# Patient Record
Sex: Female | Born: 1995 | Race: White | Hispanic: No | Marital: Single | Smoking: Never smoker
Health system: Southern US, Community
[De-identification: ages and names within clinical notes are randomized; demographics above are authoritative.]

---
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
## 2019-08-06 DIAGNOSIS — S02839A Fracture of medial orbital wall, unspecified side, initial encounter for closed fracture: Secondary | ICD-10-CM

## 2019-08-06 HISTORY — DX: Fracture of medial orbital wall, unspecified side, initial encounter for closed fracture: S02.839A

## 2020-06-13 ENCOUNTER — Other Ambulatory Visit: Payer: Self-pay

## 2020-06-13 ENCOUNTER — Emergency Department (HOSPITAL_COMMUNITY)
Admission: EM | Admit: 2020-06-13 | Discharge: 2020-06-13 | Disposition: A | Discharge status: Home / Self Care | Payer: Federal, State, Local not specified - PPO | Attending: Emergency Medicine | Admitting: Emergency Medicine

## 2020-06-13 ENCOUNTER — Encounter (HOSPITAL_COMMUNITY): Payer: Self-pay

## 2020-06-13 DIAGNOSIS — J029 Acute pharyngitis, unspecified: Secondary | ICD-10-CM | POA: Insufficient documentation

## 2020-06-13 DIAGNOSIS — R0981 Nasal congestion: Secondary | ICD-10-CM | POA: Diagnosis not present

## 2020-06-13 DIAGNOSIS — J3489 Other specified disorders of nose and nasal sinuses: Secondary | ICD-10-CM | POA: Insufficient documentation

## 2020-06-13 DIAGNOSIS — J069 Acute upper respiratory infection, unspecified: Secondary | ICD-10-CM

## 2020-06-13 NOTE — ED Provider Notes (Signed)
[facility] EMERGENCY DEPARTMENT Provider Note   CSN: [id number] Arrival date & time: 06/13/20  2019     History Chief Complaint  Patient presents with  . Nasal Congestion  . Sore Throat    Barbara Richard is a 24 y.o. female.  HPI She presents for evaluation of fever, rhinorrhea and sore throat, characterized by pain with swallowing.  Symptom onset 3 days ago.  No documented fever.  She has had 1 of 2 Covid vaccines.  No clear known sick exposures.  No nausea or vomiting.  There are no other known modifying factors.  She requests a work note to return tomorrow    History reviewed. No pertinent past medical history.  Patient Active Problem List   Diagnosis Date Noted  . Encounter for Nexplanon removal 02/26/2013  . Child in foster care 11/22/2010  . ADHD 05/04/2008    History reviewed. No pertinent surgical history.   OB History   No obstetric history on file.     History reviewed. No pertinent family history.  Social History   Tobacco Use  . Smoking status: Never Smoker  . Smokeless tobacco: Never Used  Substance Use Topics  . Alcohol use: No  . Drug use: No    Home Medications Prior to Admission medications   Medication Sig Start Date End Date Taking? Authorizing Provider  acetaminophen (TYLENOL) 500 MG tablet Take 1 tablet (500 mg total) by mouth every 6 (six) hours as needed for pain. 04/18/13   Lurene Shadow, PA-C  divalproex (DEPAKOTE) 250 MG DR tablet Take 250 mg by mouth at bedtime. Behavioral health    [provider]  etonogestrel (IMPLANON) 68 MG IMPL implant Inject 1 each into the skin once. Inserted Sept 2012    [provider]  norelgestromin-ethinyl estradiol (ORTHO EVRA) 150-35 MCG/24HR transdermal patch Place 1 patch onto the skin once a week. 02/26/13   Briscoe Deutscher, DO    Allergies    Patient has no known allergies.  Review of Systems   Review of Systems  All other systems reviewed and are  negative.   Physical Exam Updated Vital Signs BP 123/72 (BP Location: Right Arm)   Pulse (!) 116   Temp 97.9 F (36.6 C) (Oral)   Resp 16   SpO2 99%   Physical Exam Vitals and nursing note reviewed.  Constitutional:      General: She is not in acute distress.    Appearance: She is well-developed. She is not ill-appearing, toxic-appearing or diaphoretic.  HENT:     Head: Normocephalic and atraumatic.     Right Ear: External ear normal.     Left Ear: External ear normal.     Nose: No congestion or rhinorrhea.     Mouth/Throat:     Mouth: Mucous membranes are moist.     Pharynx: No oropharyngeal exudate or posterior oropharyngeal erythema.  Eyes:     Conjunctiva/sclera: Conjunctivae normal.     Pupils: Pupils are equal, round, and reactive to light.  Neck:     Trachea: Phonation normal.  Cardiovascular:     Rate and Rhythm: Normal rate and regular rhythm.     Heart sounds: Normal heart sounds.  Pulmonary:     Effort: Pulmonary effort is normal. No respiratory distress.     Breath sounds: Normal breath sounds. No stridor. No rhonchi.  Abdominal:     General: There is no distension.     Palpations: Abdomen is soft.  Musculoskeletal:  General: Normal range of motion.     Cervical back: Normal range of motion and neck supple.  Skin:    General: Skin is warm and dry.  Neurological:     Mental Status: She is alert and oriented to person, place, and time.     Cranial Nerves: No cranial nerve deficit.     Sensory: No sensory deficit.     Motor: No abnormal muscle tone.     Coordination: Coordination normal.  Psychiatric:        Mood and Affect: Mood normal.        Behavior: Behavior normal.        Thought Content: Thought content normal.        Judgment: Judgment normal.     ED Results / Procedures / Treatments   Labs (all labs ordered are listed, but only abnormal results are displayed) Labs Reviewed - No data to display  EKG None  Radiology No results  found.  Procedures Procedures (including critical care time)  Medications Ordered in ED Medications - No data to display  ED Course  I have reviewed the triage vital signs and the nursing notes.  Pertinent labs & imaging results that were available during my care of the patient were reviewed by me and considered in my medical decision making (see chart for details).    MDM Rules/Calculators/A&P                           Patient Vitals for the past 24 hrs:  BP Temp Temp src Pulse Resp SpO2  06/13/20 2041 123/72 97.9 F (36.6 C) Oral (!) 116 16 99 %    11:18 PM Reevaluation with update and discussion. After initial assessment and treatment, an updated evaluation reveals clinical evaluation indicates URI.  Findings discussed with the patient all questions were answered. Mancel Bale   Medical Decision Making:  This patient is presenting for evaluation of URI, which does not require a range of treatment options, and is not a complaint that involves a high risk of morbidity and mortality. The differential diagnoses include URI, Covid, bronchitis. I decided to review old records, and in summary healthy young female presenting with 3-day illness.  I did not require additional historical information from anyone.    Critical Interventions-clinical evaluation, observation and reassessment  After These Interventions, the Patient was reevaluated and was found stable for discharge.  Unspecified URI with normal vital signs.  Symptomatic care indicated  CRITICAL CARE-no Performed by: Mancel Bale  Nursing Notes Reviewed/ Care Coordinated Applicable Imaging Reviewed Interpretation of Laboratory Data incorporated into ED treatment  The patient appears reasonably screened and/or stabilized for discharge and I doubt any other medical condition or other [facility] requiring further screening, evaluation, or treatment in the ED at this time prior to discharge.  Plan: Home Medications-OTC as  needed; Home Treatments-rest, fluids; return here if the recommended treatment, does not improve the symptoms; Recommended follow up-PCP, as needed     Final Clinical Impression(s) / ED Diagnoses Final diagnoses:  None    Rx / DC Orders ED Discharge Orders    None       Mancel Bale, MD 06/13/20 2320

## 2020-06-13 NOTE — ED Triage Notes (Signed)
Onset 2 days runny nose, sore throat, body aches.

## 2020-06-13 NOTE — Discharge Instructions (Signed)
Use Tylenol or Motrin for ongoing pain or fever.  Gargle with salt water or use a throat lozenge for sore throat.  Try to drink plenty of water every day to improve your hydration.  Continue to wear a mask when you are around other people or at work.  See the doctor of your choice as needed for problems.

## 2020-07-18 ENCOUNTER — Emergency Department (HOSPITAL_COMMUNITY)
Admission: EM | Admit: 2020-07-18 | Discharge: 2020-07-18 | Disposition: A | Discharge status: Home / Self Care | Payer: Federal, State, Local not specified - PPO | Attending: Emergency Medicine | Admitting: Emergency Medicine

## 2020-07-18 ENCOUNTER — Other Ambulatory Visit: Payer: Self-pay

## 2020-07-18 ENCOUNTER — Emergency Department (HOSPITAL_COMMUNITY): Payer: Federal, State, Local not specified - PPO

## 2020-07-18 ENCOUNTER — Emergency Department (HOSPITAL_COMMUNITY)
Admission: EM | Admit: 2020-07-18 | Discharge: 2020-07-18 | Discharge status: Against Medical Advice | Payer: Federal, State, Local not specified - PPO

## 2020-07-18 ENCOUNTER — Encounter (HOSPITAL_COMMUNITY): Payer: Self-pay

## 2020-07-18 DIAGNOSIS — R0682 Tachypnea, not elsewhere classified: Secondary | ICD-10-CM | POA: Insufficient documentation

## 2020-07-18 DIAGNOSIS — H05239 Hemorrhage of unspecified orbit: Secondary | ICD-10-CM | POA: Diagnosis not present

## 2020-07-18 DIAGNOSIS — M79621 Pain in right upper arm: Secondary | ICD-10-CM | POA: Insufficient documentation

## 2020-07-18 DIAGNOSIS — S0285XA Fracture of orbit, unspecified, initial encounter for closed fracture: Secondary | ICD-10-CM | POA: Diagnosis not present

## 2020-07-18 DIAGNOSIS — S0500XA Injury of conjunctiva and corneal abrasion without foreign body, unspecified eye, initial encounter: Secondary | ICD-10-CM | POA: Diagnosis present

## 2020-07-18 DIAGNOSIS — R000 Tachycardia, unspecified: Secondary | ICD-10-CM | POA: Insufficient documentation

## 2020-07-18 DIAGNOSIS — Y92410 Unspecified street and highway as the place of occurrence of the external cause: Secondary | ICD-10-CM | POA: Diagnosis not present

## 2020-07-18 LAB — COMPREHENSIVE METABOLIC PANEL
ALT: 16 U/L (ref 0–44)
AST: 21 U/L (ref 15–41)
Albumin: 4.6 g/dL (ref 3.5–5.0)
Alkaline Phosphatase: 57 U/L (ref 38–126)
Anion gap: 10 (ref 5–15)
BUN: 12 mg/dL (ref 6–20)
CO2: 22 mmol/L (ref 22–32)
Calcium: 9 mg/dL (ref 8.9–10.3)
Chloride: 106 mmol/L (ref 98–111)
Creatinine, Ser: 0.57 mg/dL (ref 0.44–1.00)
GFR, Estimated: >60 mL/min (ref >60)
Glucose, Bld: 110 mg/dL — ABNORMAL HIGH (ref 70–99)
Potassium: 3.2 mmol/L — ABNORMAL LOW (ref 3.5–5.1)
Sodium: 138 mmol/L (ref 135–145)
Total Bilirubin: 0.6 mg/dL (ref 0.3–1.2)
Total Protein: 7.2 g/dL (ref 6.5–8.1)

## 2020-07-18 LAB — I-STAT CHEM 8, ED
BUN: 11 mg/dL (ref 6–20)
Calcium, Ion: 1.17 mmol/L (ref 1.15–1.40)
Chloride: 103 mmol/L (ref 98–111)
Creatinine, Ser: 0.6 mg/dL (ref 0.44–1.00)
Glucose, Bld: 108 mg/dL — ABNORMAL HIGH (ref 70–99)
HCT: 40 % (ref 36.0–46.0)
Hemoglobin: 13.6 g/dL (ref 12.0–15.0)
Potassium: 3.3 mmol/L — ABNORMAL LOW (ref 3.5–5.1)
Sodium: 139 mmol/L (ref 135–145)
TCO2: 23 mmol/L (ref 22–32)

## 2020-07-18 LAB — CBC
HCT: 40 % (ref 36.0–46.0)
Hemoglobin: 13.6 g/dL (ref 12.0–15.0)
MCH: 30.9 pg (ref 26.0–34.0)
MCHC: 34 g/dL (ref 30.0–36.0)
MCV: 90.9 fL (ref 80.0–100.0)
Platelets: 219 10*3/uL (ref 150–400)
RBC: 4.4 MIL/uL (ref 3.87–5.11)
RDW: 12.1 % (ref 11.5–15.5)
WBC: 11.1 10*3/uL — ABNORMAL HIGH (ref 4.0–10.5)
nRBC: 0 % (ref 0.0–0.2)

## 2020-07-18 LAB — SAMPLE TO BLOOD BANK

## 2020-07-18 LAB — LACTIC ACID, PLASMA: Lactic Acid, Venous: 1.9 mmol/L (ref 0.5–1.9)

## 2020-07-18 LAB — PROTIME-INR
INR: 0.9 (ref 0.8–1.2)
Prothrombin Time: 12.1 seconds (ref 11.4–15.2)

## 2020-07-18 LAB — I-STAT BETA HCG BLOOD, ED (MC, WL, AP ONLY): I-stat hCG, quantitative: <5 m[IU]/mL (ref <5)

## 2020-07-18 LAB — ETHANOL: Alcohol, Ethyl (B): <10 mg/dL (ref <10)

## 2020-07-18 MED ORDER — FENTANYL CITRATE (PF) 100 MCG/2ML IJ SOLN
50.0000 ug | Freq: Once | INTRAMUSCULAR | Status: AC
  Administered 2020-07-18: 17:00:00 50 ug via INTRAVENOUS
  Filled 2020-07-18: qty 2

## 2020-07-18 MED ORDER — HYDROCODONE-ACETAMINOPHEN 5-325 MG PO TABS: 1.0000 | ORAL_TABLET | Freq: Three times a day (TID) | ORAL | 0 refills | Status: DC | PRN

## 2020-07-18 MED ORDER — ONDANSETRON HCL 4 MG/2ML IJ SOLN
4.0000 mg | Freq: Once | INTRAMUSCULAR | Status: AC
  Administered 2020-07-18: 18:00:00 4 mg via INTRAVENOUS
  Filled 2020-07-18: qty 2

## 2020-07-18 MED ORDER — SODIUM CHLORIDE 0.9 % IV BOLUS
1000.0000 mL | Freq: Once | INTRAVENOUS | Status: AC
  Administered 2020-07-18: 17:00:00 1000 mL via INTRAVENOUS

## 2020-07-18 MED ORDER — SODIUM CHLORIDE 0.9 % IV SOLN: INTRAVENOUS | Status: DC

## 2020-07-18 MED ORDER — PSEUDOEPHEDRINE HCL 30 MG PO TABS: 15.0000 mg | ORAL_TABLET | Freq: Four times a day (QID) | ORAL | 0 refills | Status: DC | PRN

## 2020-07-18 MED ORDER — IOHEXOL 300 MG/ML  SOLN
100.0000 mL | Freq: Once | INTRAMUSCULAR | Status: AC | PRN
  Administered 2020-07-18: 18:00:00 100 mL via INTRAVENOUS

## 2020-07-18 NOTE — ED Notes (Signed)
Pt stated she needed to be seen by a doctor right away and did not want to go to triage. Pt stood up and walked out of ED stating "take me somewhere else"
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
## 2020-07-18 NOTE — ED Notes (Signed)
Patient off the floor for scan  

## 2020-07-19 ENCOUNTER — Ambulatory Visit (INDEPENDENT_AMBULATORY_CARE_PROVIDER_SITE_OTHER): Payer: Federal, State, Local not specified - PPO | Admitting: Ophthalmology

## 2020-07-19 ENCOUNTER — Encounter (INDEPENDENT_AMBULATORY_CARE_PROVIDER_SITE_OTHER): Payer: Self-pay | Admitting: Ophthalmology

## 2020-07-19 ENCOUNTER — Other Ambulatory Visit: Payer: Self-pay

## 2020-07-19 DIAGNOSIS — S0285XA Fracture of orbit, unspecified, initial encounter for closed fracture: Secondary | ICD-10-CM | POA: Diagnosis not present

## 2020-07-19 DIAGNOSIS — H3581 Retinal edema: Secondary | ICD-10-CM

## 2020-07-19 DIAGNOSIS — S0590XA Unspecified injury of unspecified eye and orbit, initial encounter: Secondary | ICD-10-CM | POA: Diagnosis not present
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
## 2022-05-05 DIAGNOSIS — N390 Urinary tract infection, site not specified: Secondary | ICD-10-CM

## 2022-05-05 HISTORY — DX: Urinary tract infection, site not specified: N39.0

## 2022-07-31 ENCOUNTER — Encounter (HOSPITAL_COMMUNITY): Payer: Self-pay | Admitting: Emergency Medicine

## 2022-07-31 ENCOUNTER — Emergency Department (HOSPITAL_COMMUNITY)
Admission: EM | Admit: 2022-07-31 | Discharge: 2022-08-01 | Disposition: A | Discharge status: Home / Self Care | Payer: Federal, State, Local not specified - PPO | Attending: Emergency Medicine | Admitting: Emergency Medicine

## 2022-07-31 DIAGNOSIS — S02839A Fracture of medial orbital wall, unspecified side, initial encounter for closed fracture: Secondary | ICD-10-CM

## 2022-07-31 DIAGNOSIS — S299XXA Unspecified injury of thorax, initial encounter: Secondary | ICD-10-CM | POA: Insufficient documentation

## 2022-07-31 DIAGNOSIS — W19XXXA Unspecified fall, initial encounter: Secondary | ICD-10-CM

## 2022-07-31 DIAGNOSIS — W1830XA Fall on same level, unspecified, initial encounter: Secondary | ICD-10-CM | POA: Insufficient documentation

## 2022-07-31 DIAGNOSIS — S3992XA Unspecified injury of lower back, initial encounter: Secondary | ICD-10-CM | POA: Diagnosis not present

## 2022-07-31 DIAGNOSIS — M546 Pain in thoracic spine: Secondary | ICD-10-CM | POA: Diagnosis present

## 2022-07-31 NOTE — ED Triage Notes (Addendum)
Pt states that she woke up this morning from a hangover. Noticed her back was hurting. Reports bruising down her spine. Denies urinary.fecal incontinence or perineal numbness. Denies radiation. Denies known head injury. Steadily ambulatory without assistance.

## 2022-08-01 ENCOUNTER — Encounter (HOSPITAL_COMMUNITY): Payer: Self-pay | Admitting: Emergency Medicine

## 2022-08-01 ENCOUNTER — Other Ambulatory Visit: Payer: Self-pay

## 2022-08-01 ENCOUNTER — Emergency Department (HOSPITAL_COMMUNITY): Payer: Federal, State, Local not specified - PPO

## 2022-08-01 DIAGNOSIS — S02839A Fracture of medial orbital wall, unspecified side, initial encounter for closed fracture: Secondary | ICD-10-CM

## 2022-08-01 DIAGNOSIS — S299XXA Unspecified injury of thorax, initial encounter: Secondary | ICD-10-CM | POA: Diagnosis not present

## 2022-08-01 LAB — PREGNANCY, URINE: Preg Test, Ur: NEGATIVE

## 2022-08-01 MED ORDER — HYDROCODONE-ACETAMINOPHEN 5-325 MG PO TABS
1.0000 | ORAL_TABLET | Freq: Once | ORAL | Status: AC
  Administered 2022-08-01: 1 via ORAL
  Filled 2022-08-01: qty 1

## 2022-08-01 MED ORDER — NAPROXEN 500 MG PO TABS
500.0000 mg | ORAL_TABLET | Freq: Once | ORAL | Status: AC
Start: 2022-08-01 — End: 2022-08-01
  Administered 2022-08-01: 500 mg via ORAL
  Filled 2022-08-01: qty 1

## 2022-08-01 NOTE — ED Notes (Signed)
Pt ambulatory without assistance.  
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
## 2023-07-01 ENCOUNTER — Emergency Department (HOSPITAL_COMMUNITY)
Admission: EM | Admit: 2023-07-01 | Discharge: 2023-07-01 | Disposition: A | Discharge status: Home / Self Care | Payer: Federal, State, Local not specified - PPO | Attending: Emergency Medicine | Admitting: Emergency Medicine

## 2023-07-01 ENCOUNTER — Emergency Department (HOSPITAL_COMMUNITY): Payer: Federal, State, Local not specified - PPO

## 2023-07-01 ENCOUNTER — Other Ambulatory Visit: Payer: Self-pay

## 2023-07-01 ENCOUNTER — Encounter (HOSPITAL_COMMUNITY): Payer: Self-pay

## 2023-07-01 DIAGNOSIS — R0781 Pleurodynia: Secondary | ICD-10-CM | POA: Diagnosis present

## 2023-07-01 DIAGNOSIS — R052 Subacute cough: Secondary | ICD-10-CM | POA: Diagnosis not present

## 2023-07-01 MED ORDER — LIDOCAINE 5 % EX PTCH
1.0000 | MEDICATED_PATCH | CUTANEOUS | Status: DC
  Administered 2023-07-01: 1 via TRANSDERMAL
  Filled 2023-07-01: qty 1

## 2023-07-01 NOTE — ED Triage Notes (Signed)
C/o productive cough x3 weeks.  3 days ago started having left rib pain that is worse with movement, cough, and palpitation. Denies sob/cp Ambulatory to triage

## 2023-07-01 NOTE — ED Provider Notes (Signed)
[place] EMERGENCY DEPARTMENT AT [facility] Provider Note   CSN: [id number] Arrival date & time: 07/01/23  1157     History  Chief Complaint  Patient presents with   Cough    Barbara Richard is a 27 y.o. female with no significant past medical history presents the ED today for rib pain.  Patient reports pain to the anterior left ribs for the past 3 days, worse with movement and deep breaths.  She states that she has had a cough for the past several weeks which has improved since onset.  No shortness of breath or chest pain.  No recent injury or trauma.  She has taken Ibuprofen once for the pain without relief.  She denies any additional complaints or concerns at this time.    Home Medications Prior to Admission medications   Not on File      Allergies    Patient has no known allergies.    Review of Systems   Review of Systems  Respiratory:  Positive for cough.   All other systems reviewed and are negative.   Physical Exam Updated Vital Signs BP 113/63   Pulse 81   Temp 97.8 F (36.6 C) (Oral)   Resp 16   Ht 5\' 5"  (1.651 m)   Wt 61.7 kg   SpO2 100%   BMI 22.63 kg/m  Physical Exam Vitals and nursing note reviewed.  Constitutional:      General: She is not in acute distress.    Appearance: Normal appearance.  HENT:     Head: Normocephalic and atraumatic.     Mouth/Throat:     Mouth: Mucous membranes are moist.  Eyes:     Conjunctiva/sclera: Conjunctivae normal.     Pupils: Pupils are equal, round, and reactive to light.  Cardiovascular:     Rate and Rhythm: Normal rate and regular rhythm.     Pulses: Normal pulses.     Heart sounds: Normal heart sounds.  Pulmonary:     Effort: Pulmonary effort is normal.     Breath sounds: Normal breath sounds.  Abdominal:     Palpations: Abdomen is soft.     Tenderness: There is no abdominal tenderness.  Musculoskeletal:        General: Tenderness present. No deformity. Normal range of motion.     Comments:  Tenderness to palpation of the anterior left ribs without obvious bony deformity.  Skin:    General: Skin is warm and dry.     Findings: No rash.  Neurological:     General: No focal deficit present.     Mental Status: She is alert.     Sensory: No sensory deficit.     Motor: No weakness.  Psychiatric:        Mood and Affect: Mood normal.        Behavior: Behavior normal.     ED Results / Procedures / Treatments   Labs (all labs ordered are listed, but only abnormal results are displayed) Labs Reviewed - No data to display  EKG None  Radiology DG Ribs Unilateral W/Chest Left  Result Date: 07/01/2023 CLINICAL DATA:  Three-week history of productive cough and left rib pain EXAM: LEFT RIBS AND CHEST - 3 VIEW COMPARISON:  Chest radiograph dated 07/18/2020 FINDINGS: No fracture or other bone lesions are seen involving the ribs. There is no evidence of pneumothorax or pleural effusion. Both lungs are clear. Heart size and mediastinal contours are within normal limits. IMPRESSION: No acute displaced  rib fracture.  No focal consolidations. Electronically Signed   By: Agustin Cree M.D.   On: 07/01/2023 13:51    Procedures Procedures: not indicated.   Medications Ordered in ED Medications  lidocaine (LIDODERM) 5 % 1 patch (1 patch Transdermal Patch Applied 07/01/23 1405)    ED Course/ Medical Decision Making/ A&P                                 Medical Decision Making Amount and/or Complexity of Data Reviewed Radiology: ordered.  Risk Prescription drug management.   This patient presents to the ED for concern of left rib pain, this involves an extensive number of treatment options, and is a complaint that carries with it a high risk of complications and morbidity.   Differential diagnosis includes: fracture, contusion, muscle strain, etc.   Comorbidities  No significant past medical history   Additional History  Additional history obtained from prior  records.   Imaging Studies  I ordered imaging studies including left ribs with left chest x-ray. I independently visualized and interpreted imaging which showed: No acute displaced rib fracture.  No focal consolidations. I agree with the radiologist interpretation   Problem List / ED Course / Critical Interventions / Medication Management  Left sided rib pain x3 days I ordered medications including: Lidocaine patch for pain - given prior to discharge I have reviewed the patients home medicines and have made adjustments as needed   Social Determinants of Health  Access to healthcare   Test / Admission - Considered  Discussed findings with patient. She is hemodynamically stable and safe for discharge home. Advised to use OTC lidocaine patches and analgesics to help with the pain. Return precautions provided.       Final Clinical Impression(s) / ED Diagnoses Final diagnoses:  Rib pain on left side  Subacute cough    Rx / DC Orders ED Discharge Orders     None         Maxwell Marion, PA-C 07/01/23 1420    Lorre Nick, MD 07/02/23 [phone]

## 2023-07-01 NOTE — ED Notes (Signed)
Pt taken to Xray.

## 2023-07-01 NOTE — Discharge Instructions (Signed)
As discussed, your imaging does not show signs of fracture.  You likely have a muscle strain.  You can use OTC lidocaine patches to help with the pain.  Additionally, you can alternate between Tylenol and Ibuprofen every 4 hours as needed. Ibuprofen, and other NSAIDs can help with inflammation.  Follow-up with your primary care in the next 5 to 7 days for reevaluation of your symptoms.  Get help right away if: You feel sick to your stomach (nauseous) or you throw up (vomit). You feel sweaty or light-headed. You have a cough with mucus from your lungs (sputum) or you cough up blood. You are short of breath.
